# Patient Record
Sex: Female | Born: 1980 | Race: White | Hispanic: No | Marital: Single | Smoking: Current every day smoker
Health system: Southern US, Community
[De-identification: ages and names within clinical notes are randomized; demographics above are authoritative.]

## PROBLEM LIST (undated history)

## (undated) DIAGNOSIS — E7800 Pure hypercholesterolemia, unspecified: Secondary | ICD-10-CM

## (undated) HISTORY — PX: CHOLECYSTECTOMY: SHX55

## (undated) HISTORY — PX: KNEE ARTHROSCOPY: SUR90

## (undated) HISTORY — PX: TUBAL LIGATION: SHX77

---

## 2004-03-15 ENCOUNTER — Emergency Department (HOSPITAL_COMMUNITY): Admission: EM | Admit: 2004-03-15 | Discharge: 2004-03-15 | Payer: Self-pay | Admitting: Emergency Medicine

## 2014-12-27 ENCOUNTER — Emergency Department: Payer: Self-pay | Admitting: Emergency Medicine

## 2015-06-23 ENCOUNTER — Emergency Department
Admission: EM | Admit: 2015-06-23 | Discharge: 2015-06-23 | Disposition: A | Discharge status: Home / Self Care | Payer: Commercial Managed Care - HMO | Attending: Emergency Medicine | Admitting: Emergency Medicine

## 2015-06-23 ENCOUNTER — Encounter: Payer: Self-pay | Admitting: *Deleted

## 2015-06-23 ENCOUNTER — Emergency Department: Payer: Commercial Managed Care - HMO

## 2015-06-23 DIAGNOSIS — Z79899 Other long term (current) drug therapy: Secondary | ICD-10-CM | POA: Insufficient documentation

## 2015-06-23 DIAGNOSIS — Z7982 Long term (current) use of aspirin: Secondary | ICD-10-CM | POA: Diagnosis not present

## 2015-06-23 DIAGNOSIS — Z880 Allergy status to penicillin: Secondary | ICD-10-CM | POA: Insufficient documentation

## 2015-06-23 DIAGNOSIS — Z720 Tobacco use: Secondary | ICD-10-CM | POA: Insufficient documentation

## 2015-06-23 DIAGNOSIS — R079 Chest pain, unspecified: Secondary | ICD-10-CM | POA: Diagnosis present

## 2015-06-23 HISTORY — DX: Pure hypercholesterolemia, unspecified: E78.00

## 2015-06-23 LAB — CBC
HCT: 42.8 % (ref 35.0–47.0)
HEMOGLOBIN: 14.2 g/dL (ref 12.0–16.0)
MCH: 30.3 pg (ref 26.0–34.0)
MCHC: 33.1 g/dL (ref 32.0–36.0)
MCV: 91.3 fL (ref 80.0–100.0)
PLATELETS: 278 10*3/uL (ref 150–440)
RBC: 4.69 MIL/uL (ref 3.80–5.20)
RDW: 13.3 % (ref 11.5–14.5)
WBC: 18.6 10*3/uL — AB (ref 3.6–11.0)

## 2015-06-23 LAB — COMPREHENSIVE METABOLIC PANEL
ALK PHOS: 82 U/L (ref 38–126)
ALT: 21 U/L (ref 14–54)
AST: 23 U/L (ref 15–41)
Albumin: 3.7 g/dL (ref 3.5–5.0)
Anion gap: 8 (ref 5–15)
BUN: 7 mg/dL (ref 6–20)
CHLORIDE: 107 mmol/L (ref 101–111)
CO2: 24 mmol/L (ref 22–32)
Calcium: 8.8 mg/dL — ABNORMAL LOW (ref 8.9–10.3)
Creatinine, Ser: 0.58 mg/dL (ref 0.44–1.00)
Glucose, Bld: 96 mg/dL (ref 65–99)
POTASSIUM: 3.7 mmol/L (ref 3.5–5.1)
Sodium: 139 mmol/L (ref 135–145)
Total Bilirubin: 0.5 mg/dL (ref 0.3–1.2)
Total Protein: 7 g/dL (ref 6.5–8.1)

## 2015-06-23 LAB — TROPONIN I: Troponin I: <0.03 ng/mL (ref <0.031)

## 2015-06-23 LAB — FIBRIN DERIVATIVES D-DIMER (ARMC ONLY): Fibrin derivatives D-dimer (ARMC): 269.01 (ref 0–499)

## 2015-06-23 NOTE — Discharge Instructions (Signed)

## 2015-06-23 NOTE — ED Notes (Signed)
Pt here with c/o chest pain, sharp in nature left side of chest.  EMS placed IV and completed EKG.

## 2015-06-23 NOTE — ED Notes (Signed)
Pt given coke, crackers and peanut butter. Ok'ed by Dr. Cyril Loosen

## 2015-06-23 NOTE — ED Provider Notes (Signed)
[facility] Emergency Department Provider Note  ____________________________________________  Time seen: On arrival, via EMS  I have reviewed the triage vital signs and the nursing notes.   HISTORY  Chief Complaint Chest Pain    HPI Kimberly Lambert is a 34 y.o. female who presents with complaints of chest pain. She reports she initially had pain yesterday at 6 PM. She described as a pain in her side that traveled under her left breast into her sternum. It resolved after she took a baby aspirin and she felt well until 4 AM this morning when she developed pain when she got up to go the bathroom. She reports the pain was reproduced by EMS pressure on her sternum. She denies shortness of breath today. She reports she smokes has high cholesterol and her mother died at age 64 with a massive heart attack. Currently she reports her chest pain is achy and very mild. No recent travel, no calf pain.     Past Medical History  Diagnosis Date  . High cholesterol     There are no active problems to display for this patient.   Past Surgical History  Procedure Laterality Date  . Knee arthroscopy    . Tubal ligation    . Cholecystectomy      Current Outpatient Rx  Name  Route  Sig  Dispense  Refill  . aspirin EC 81 MG tablet   Oral   Take 81 mg by mouth daily.         . pravastatin (PRAVACHOL) 20 MG tablet   Oral   Take 20 mg by mouth daily.         . propranolol (INDERAL) 40 MG tablet   Oral   Take 40 mg by mouth 2 (two) times daily.           Allergies Claritin-d 12 hour; Naproxen; and Penicillins  No family history on file.  Social History History  Substance Use Topics  . Smoking status: Current Every Day Smoker -- 1.50 packs/day for 18 years    Types: Cigarettes  . Smokeless tobacco: Not on file  . Alcohol Use: No    Review of Systems  Constitutional: Negative for fever. Eyes: Negative for visual changes. ENT: Negative for sore  throat Cardiovascular: Positive for chest pain Respiratory: Negative for shortness of breath. Gastrointestinal: Negative for abdominal pain, vomiting and diarrhea. Genitourinary: Negative for dysuria. Musculoskeletal: Negative for back pain. Skin: Negative for rash. Neurological: Negative for headaches or focal weakness Psychiatric: No anxiety    ____________________________________________   PHYSICAL EXAM:  VITAL SIGNS: BP 107/74 mmHg  Pulse 69  Temp(Src) 97.8 F (36.6 C) (Oral)  Resp 18  Ht  (1.626 m)  Wt 180 lb (81.647 kg)  BMI 30.88 kg/m2  SpO2 94%  LMP 05/23/2015  ED Triage Vitals  Enc Vitals Group     BP --      Pulse --      Resp --      Temp --      Temp src --      SpO2 --      Weight --      Height --      Head Cir --      Peak Flow --      Pain Score --      Pain Loc --      Pain Edu? --      Excl. in GC? --      Constitutional:  Alert and oriented. Well appearing and in no distress. Eyes: Conjunctivae are normal.  ENT   Head: Normocephalic and atraumatic.   Mouth/Throat: Mucous membranes are moist. Cardiovascular: Normal rate, regular rhythm. Normal and symmetric distal pulses are present in all extremities. No murmurs, rubs, or gallops. Respiratory: Normal respiratory effort without tachypnea nor retractions. Breath sounds are clear and equal bilaterally.  Gastrointestinal: Soft and non-tender in all quadrants. No distention. There is no CVA tenderness. Genitourinary: deferred Musculoskeletal: Nontender with normal range of motion in all extremities. No lower extremity tenderness nor edema. Neurologic:  Normal speech and language. No gross focal neurologic deficits are appreciated. Skin:  Skin is warm, dry and intact. No rash noted. Psychiatric: Mood and affect are normal. Patient exhibits appropriate insight and judgment.  ____________________________________________    LABS (pertinent positives/negatives)  Labs Reviewed  CBC  - Abnormal; Notable for the following:    WBC 18.6 (*)    All other components within normal limits  COMPREHENSIVE METABOLIC PANEL - Abnormal; Notable for the following:    Calcium 8.8 (*)    All other components within normal limits  TROPONIN I  FIBRIN DERIVATIVES D-DIMER ([facility] ONLY)  TROPONIN I    ____________________________________________   EKG  ED ECG REPORT I, Jene Every, the attending physician, personally viewed and interpreted this ECG.   Date: 06/23/2015  EKG Time: 9:16 AM  Rate: 73  Rhythm: normal sinus rhythm  Axis: Normal  Intervals:none  ST&T Change: None   ____________________________________________    RADIOLOGY I have personally reviewed any xrays that were ordered on this patient: Chest x-ray unremarkable  ____________________________________________   PROCEDURES  Procedure(s) performed: none  Critical Care performed: none  ____________________________________________   INITIAL IMPRESSION / ASSESSMENT AND PLAN / ED COURSE  Pertinent labs & imaging results that were available during my care of the patient were reviewed by me and considered in my medical decision making (see chart for details).  Visual prominent and d-dimer are negative. We will send a second troponin. Unclear cause of elevated white blood cell count, no signs of infection or distress  Second troponin negative. Patient chest pain-free. She will follow-up with cardiology tomorrow for further evaluation. She knows to return to the emergency department if any return of her symptoms  ____________________________________________   FINAL CLINICAL IMPRESSION(S) / ED DIAGNOSES  Final diagnoses:  Chest pain, unspecified chest pain type     Jene Every, MD 06/23/15 [phone]

## 2016-02-08 DIAGNOSIS — L308 Other specified dermatitis: Secondary | ICD-10-CM | POA: Insufficient documentation

## 2016-10-14 ENCOUNTER — Ambulatory Visit
Admission: EM | Admit: 2016-10-14 | Discharge: 2016-10-14 | Disposition: A | Discharge status: Home / Self Care | Payer: Medicare Other | Attending: Emergency Medicine | Admitting: Emergency Medicine

## 2016-10-14 ENCOUNTER — Encounter: Payer: Self-pay | Admitting: Gynecology

## 2016-10-14 DIAGNOSIS — J069 Acute upper respiratory infection, unspecified: Secondary | ICD-10-CM

## 2016-10-14 MED ORDER — CEFUROXIME AXETIL 250 MG PO TABS: ORAL_TABLET | ORAL | 0 refills | Status: DC

## 2016-10-14 NOTE — ED Provider Notes (Signed)
CSN: [id number]     Arrival date & time 10/14/16  1330 History   First MD Initiated Contact with Patient 10/14/16 1636     Chief Complaint  Patient presents with  . Cough   (Consider location/radiation/quality/duration/timing/severity/associated sxs/prior Treatment) HPI  This a [age] year old female who presents with cough that she's had for over a week. Concerned that she may have a bronchitis. Is not had a fever or chills.      Past Medical History:  Diagnosis Date  . High cholesterol    Past Surgical History:  Procedure Laterality Date  . CHOLECYSTECTOMY    . KNEE ARTHROSCOPY    . TUBAL LIGATION     No family history on file. Social History  Substance Use Topics  . Smoking status: Current Every Day Smoker    Packs/day: 1.50    Years: 18.00    Types: Cigarettes  . Smokeless tobacco: Never Used  . Alcohol use No   OB History    No data available     Review of Systems  Constitutional: Positive for activity change. Negative for appetite change, chills, fatigue and fever.  HENT: Positive for congestion, sinus pain and sinus pressure.   Respiratory: Positive for cough.   All other systems reviewed and are negative.   Allergies  Claritin-d 12 hour [loratadine-pseudoephedrine er]; Naproxen; and Penicillins  Home Medications   Prior to Admission medications   Medication Sig Start Date End Date Taking? Authorizing Provider  aspirin EC 81 MG tablet Take 81 mg by mouth daily.   Yes Historical Provider, MD  pravastatin (PRAVACHOL) 20 MG tablet Take 20 mg by mouth daily.   Yes Historical Provider, MD  propranolol (INDERAL) 40 MG tablet Take 40 mg by mouth 2 (two) times daily.   Yes Historical Provider, MD  cefUROXime (CEFTIN) 250 MG tablet Take 1 tablet twice daily until all gone; take with food 10/14/16   Lutricia FeilWilliam P Roemer, PA-C   Meds Ordered and Administered this Visit  Medications - No data to display  BP (!) 135/99 (BP Location: Right Arm)   Pulse 81   Temp 98.1  F (36.7 C)   Resp 16   Ht 5\' 4"  (1.626 m)   Wt 190 lb (86.2 kg)   LMP 10/07/2016   SpO2 99%   BMI 32.61 kg/m  No data found.   Physical Exam  Constitutional: She is oriented to person, place, and time. She appears well-developed and well-nourished. No distress.  HENT:  Head: Normocephalic and atraumatic.  Right Ear: External ear normal.  Left Ear: External ear normal.  Nose: Nose normal.  Mouth/Throat: Oropharynx is clear and moist.  Eyes: EOM are normal. Pupils are equal, round, and reactive to light. Right eye exhibits no discharge. Left eye exhibits no discharge.  Neck: Normal range of motion. Neck supple.  Pulmonary/Chest: Effort normal and breath sounds normal. No respiratory distress. She has no wheezes. She has no rales.  Musculoskeletal: Normal range of motion.  Lymphadenopathy:    She has no cervical adenopathy.  Neurological: She is alert and oriented to person, place, and time.  Skin: Skin is warm and dry. She is not diaphoretic.  Psychiatric: She has a normal mood and affect. Her behavior is normal. Judgment and thought content normal.  Nursing note and vitals reviewed.   Urgent Care Course   Clinical Course     Procedures (including critical care time)  Labs Review Labs Reviewed - No data to display  Imaging Review No results found.  Visual Acuity Review  Right Eye Distance:   Left Eye Distance:   Bilateral Distance:    Right Eye Near:   Left Eye Near:    Bilateral Near:         MDM   1. Acute upper respiratory infection    Discharge Medication List as of 10/14/2016  4:39 PM    START taking these medications   Details  cefUROXime (CEFTIN) 250 MG tablet Take 1 tablet twice daily until all gone; take with food, Normal      Plan: 1. Test/x-ray results and diagnosis reviewed with patient 2. rx as per orders; risks, benefits, potential side effects reviewed with patient 3. Recommend supportive treatment with Rest and hydration. Is  not improving she should follow-up with her primary care physician. I requested Her to use Flonase which she states she has at home. 4. F/u prn if symptoms worsen or don't improve     Lutricia FeilWilliam P Roemer, PA-C 10/14/16 1818

## 2016-10-14 NOTE — ED Triage Notes (Signed)
Per patient cough over 1 week . Patietn question bronchitis.

## 2016-10-31 ENCOUNTER — Other Ambulatory Visit: Payer: Self-pay | Admitting: Podiatry

## 2016-10-31 DIAGNOSIS — G5762 Lesion of plantar nerve, left lower limb: Secondary | ICD-10-CM

## 2016-11-14 ENCOUNTER — Ambulatory Visit: Payer: Medicare Other

## 2016-11-16 ENCOUNTER — Encounter: Payer: Self-pay | Admitting: Dietician

## 2016-11-24 ENCOUNTER — Ambulatory Visit
Admission: RE | Admit: 2016-11-24 | Discharge: 2016-11-24 | Disposition: A | Discharge status: Home / Self Care | Payer: Medicare Other | Source: Ambulatory Visit | Attending: Podiatry | Admitting: Podiatry

## 2016-11-24 DIAGNOSIS — G5762 Lesion of plantar nerve, left lower limb: Secondary | ICD-10-CM | POA: Diagnosis present

## 2016-11-24 DIAGNOSIS — M85672 Other cyst of bone, left ankle and foot: Secondary | ICD-10-CM | POA: Diagnosis not present

## 2016-11-24 MED ORDER — GADOBENATE DIMEGLUMINE 529 MG/ML IV SOLN
20.0000 mL | Freq: Once | INTRAVENOUS | Status: AC | PRN
  Administered 2016-11-24: 20 mL via INTRAVENOUS

## 2016-11-28 ENCOUNTER — Encounter: Payer: Self-pay | Admitting: Dietician

## 2016-11-28 NOTE — Progress Notes (Signed)
Patient did not keep her appointment on 11/16/16. Sent letter to MD.

## 2017-01-14 ENCOUNTER — Inpatient Hospital Stay: Payer: Medicare Other

## 2017-01-14 ENCOUNTER — Encounter: Payer: Self-pay | Admitting: Oncology

## 2017-01-14 ENCOUNTER — Inpatient Hospital Stay: Payer: Medicare Other | Attending: Oncology | Admitting: Oncology

## 2017-01-14 VITALS — BP 123/86 | HR 94 | Temp 97.8°F | Resp 20 | Ht 64.37 in | Wt 251.5 lb

## 2017-01-14 DIAGNOSIS — Z79899 Other long term (current) drug therapy: Secondary | ICD-10-CM | POA: Insufficient documentation

## 2017-01-14 DIAGNOSIS — D72829 Elevated white blood cell count, unspecified: Secondary | ICD-10-CM | POA: Insufficient documentation

## 2017-01-14 DIAGNOSIS — Z7982 Long term (current) use of aspirin: Secondary | ICD-10-CM | POA: Diagnosis not present

## 2017-01-14 DIAGNOSIS — E7800 Pure hypercholesterolemia, unspecified: Secondary | ICD-10-CM | POA: Diagnosis not present

## 2017-01-14 DIAGNOSIS — F17210 Nicotine dependence, cigarettes, uncomplicated: Secondary | ICD-10-CM | POA: Diagnosis not present

## 2017-01-14 DIAGNOSIS — E785 Hyperlipidemia, unspecified: Secondary | ICD-10-CM | POA: Insufficient documentation

## 2017-01-14 DIAGNOSIS — I499 Cardiac arrhythmia, unspecified: Secondary | ICD-10-CM | POA: Insufficient documentation

## 2017-01-14 LAB — DIFFERENTIAL
BASOS ABS: 0.2 10*3/uL — AB (ref 0–0.1)
Basophils Relative: 1 %
Eosinophils Absolute: 0.6 10*3/uL (ref 0–0.7)
Eosinophils Relative: 3 %
LYMPHS ABS: 5.4 10*3/uL — AB (ref 1.0–3.6)
LYMPHS PCT: 29 %
Monocytes Absolute: 1 10*3/uL — ABNORMAL HIGH (ref 0.2–0.9)
Monocytes Relative: 5 %
NEUTROS PCT: 62 %
Neutro Abs: 11.4 10*3/uL — ABNORMAL HIGH (ref 1.4–6.5)

## 2017-01-14 LAB — CBC
HCT: 43.7 % (ref 35.0–47.0)
HEMOGLOBIN: 14.8 g/dL (ref 12.0–16.0)
MCH: 30.7 pg (ref 26.0–34.0)
MCHC: 33.8 g/dL (ref 32.0–36.0)
MCV: 90.8 fL (ref 80.0–100.0)
PLATELETS: 318 10*3/uL (ref 150–440)
RBC: 4.81 MIL/uL (ref 3.80–5.20)
RDW: 13.5 % (ref 11.5–14.5)
WBC: 18.6 10*3/uL — AB (ref 3.6–11.0)

## 2017-01-14 LAB — FOLATE: FOLATE: 4.6 ng/mL — AB (ref >5.9)

## 2017-01-14 LAB — SEDIMENTATION RATE: SED RATE: 23 mm/h — AB (ref 0–20)

## 2017-01-14 NOTE — Progress Notes (Signed)
New referral for leucocytosis. Denies night sweats or fevers. Denies any recent infections. No noticeable swollen glands. Patient says she stays tired all the time. 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

## 2017-01-14 NOTE — Progress Notes (Signed)
Hematology/Oncology Consult note [facility] Telephone:([phone] [phone]  Patient Care Team: Sharyne Peach, MD as PCP - General (Family Medicine)   Name of the patient: Kimberly Lambert  [id number]  [birth year]    Reason for referral- leucocytosis   Referring physician- Dr. Salome Holmes  Date of visit: 01/14/17   History of presenting illness- patient is a 36 year old female who was been referred to [place] for evaluation and management of leukocytosis. CBC from 01/09/2017 revealed white count of 18.8, H&H of 14.6/43.4 and a platelet count of 354. Differential on the CBC CEA showed neutrophilia and lymphocytosis and mild monocytosis. Prior CBC from 01/03/2017 showed a white count of 20.1. CMP was within normal limits. Prior CBC from August 2016 showed a white count of 18.6. Patient reports that she is always had a white count that was high even when she was in [place] which was more than 3 years ago. She has been smoking daily about 2-3 cigarettes per day over the last 20 years. Denies any appetite loss or unintentional weight loss or recurrent infections. She does report burning sensation in her feet  ECOG PS- 1  Pain scale- 3   Review of systems- Review of Systems  Constitutional: Negative for chills, fever, malaise/fatigue and weight loss.  HENT: Negative for congestion, ear discharge and nosebleeds.   Eyes: Negative for blurred vision.  Respiratory: Negative for cough, hemoptysis, sputum production, shortness of breath and wheezing.   Cardiovascular: Negative for chest pain, palpitations, orthopnea and claudication.  Gastrointestinal: Negative for abdominal pain, blood in stool, constipation, diarrhea, heartburn, melena, nausea and vomiting.  Genitourinary: Negative for dysuria, flank pain, frequency, hematuria and urgency.  Musculoskeletal: Negative for back pain, joint pain and myalgias.  Skin: Negative for rash.  Neurological: Negative for  dizziness, tingling, focal weakness, seizures, weakness and headaches.  Endo/Heme/Allergies: Does not bruise/bleed easily.  Psychiatric/Behavioral: Negative for depression and suicidal ideas. The patient does not have insomnia.     Allergies  Allergen Reactions  . Claritin-D 12 Hour [Loratadine-Pseudoephedrine Er] Rash  . Naproxen Rash  . Penicillins Rash    Patient Active Problem List   Diagnosis Date Noted  . Dysrhythmia 01/14/2017  . Hyperlipidemia, unspecified 01/14/2017  . 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 High cholesterol      Past Surgical History:  Procedure Laterality Date  . CHOLECYSTECTOMY    . KNEE ARTHROSCOPY    . TUBAL LIGATION      Social History   Social History  . Marital status: Single    Spouse name: N/A  . Number of children: N/A  . Years of education: N/A   Occupational History  . Not on file.   Social History Main Topics  . Smoking status: Current Every Day Smoker    Packs/day: 1.50    Years: 18.00    Types: Cigarettes  . Smokeless tobacco: Never Used  . Alcohol use No  . Drug use: No  . Sexual activity: Yes   Other Topics Concern  . Not on file   Social History Narrative  . No narrative on file     No family history on file.   Current Outpatient Prescriptions:  .  aspirin EC 81 MG tablet, Take 81 mg by mouth daily., Disp: , Rfl:  .  pravastatin (PRAVACHOL) 20 MG tablet, Take 20 mg by mouth daily., Disp: , Rfl:  .  propranolol (INDERAL) 40 MG tablet, Take 40 mg  by mouth 2 (two) times daily., Disp: , Rfl:    Physical exam:  Vitals:   01/14/17 1457  BP: 123/86  Pulse: 94  Resp: 20  Temp: 97.8 F (36.6 C)  TempSrc: Tympanic  Weight: 251 lb 8.7 oz (114.1 kg)  Height: 5' 4.37" (1.635 m)   Physical Exam  Constitutional: She is oriented to person, place, and time.  Patient is obese and appears in no acute distress.  HENT:  Head: Normocephalic and atraumatic.  endentulous    Eyes: EOM are normal. Pupils are equal, round, and reactive to light.  Neck: Normal range of motion.  Cardiovascular: Normal rate, regular rhythm and normal heart sounds.   Pulmonary/Chest: Effort normal and breath sounds normal.  Abdominal: Soft. Bowel sounds are normal.  Neurological: She is alert and oriented to person, place, and time.  Skin: Skin is warm and dry.       CMP Latest Ref Rng & Units 06/23/2015  Glucose 65 - 99 mg/dL 96  BUN 6 - 20 mg/dL 7  Creatinine 0.44 - 1.00 mg/dL 0.58  Sodium 135 - 145 mmol/L 139  Potassium 3.5 - 5.1 mmol/L 3.7  Chloride 101 - 111 mmol/L 107  CO2 22 - 32 mmol/L 24  Calcium 8.9 - 10.3 mg/dL 8.8(L)  Total Protein 6.5 - 8.1 g/dL 7.0  Total Bilirubin 0.3 - 1.2 mg/dL 0.5  Alkaline Phos 38 - 126 U/L 82  AST 15 - 41 U/L 23  ALT 14 - 54 U/L 21   CBC Latest Ref Rng & Units 06/23/2015  WBC 3.6 - 11.0 K/uL 18.6(H)  Hemoglobin 12.0 - 16.0 g/dL 14.2  Hematocrit 35.0 - 47.0 % 42.8  Platelets 150 - 440 K/uL 278     Assessment and plan- Patient is a 36 y.o. 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  On reviewing patient's prior CBCs she is always had an elevated white count of around 18. Most recent differential on the CBC reveals neutrophilia and lymphocytosis as well as monocytosis. Today I will repeat his CBC with manual differential and obtained a pathologic review of her smear. I will also check ESR, CRP, peripheral flow cytometry and BCR abl testing. I will see the patient back in 2 weeks' time to discuss the results of her blood work and further intervention is need be.   Thank you for this kind referral and the opportunity to participate in the care of this patient   Visit Diagnosis 1. Leukocytosis, unspecified type     Dr. Randa Evens, MD, MPH [facility] at [facility] Pager- [phone] 01/14/2017  2:44 PM

## 2017-01-15 ENCOUNTER — Other Ambulatory Visit: Payer: Self-pay | Admitting: Oncology

## 2017-01-15 DIAGNOSIS — E538 Deficiency of other specified B group vitamins: Secondary | ICD-10-CM | POA: Insufficient documentation

## 2017-01-15 LAB — PATHOLOGIST SMEAR REVIEW

## 2017-01-15 LAB — VITAMIN B12: Vitamin B-12: 133 pg/mL — ABNORMAL LOW (ref 180–914)

## 2017-01-15 LAB — C-REACTIVE PROTEIN: CRP: 3.5 mg/dL — AB (ref <1.0)

## 2017-01-15 NOTE — Progress Notes (Signed)
Patient found to have severe B 12 deficiency. She also complains of bilateral LE burning which could be from b12 deficiency. Will give 1000 mcg weekly X 4 doses followed by monthly doses for 6 months. Check anti intrinsic factor ab with next visit

## 2017-01-18 ENCOUNTER — Encounter: Payer: Self-pay | Admitting: Family Medicine

## 2017-01-19 LAB — BCR-ABL1, CML/ALL, PCR, QUANT

## 2017-01-19 LAB — COMP PANEL: LEUKEMIA/LYMPHOMA

## 2017-01-21 ENCOUNTER — Telehealth: Payer: Self-pay | Admitting: *Deleted

## 2017-01-21 NOTE — Telephone Encounter (Signed)
I called patient to let her know Dr Smith Robertao would like her to start taking B12 injections as her blood level was low. Patient would be taking 1 every week for 4 weeks then  1 injection monthly therafter. Patient is in agreement and would like to start taking injections this Wednesday morning in [place] [facility] at 10am.. Scheduling order was put in system.

## 2017-01-23 ENCOUNTER — Inpatient Hospital Stay: Payer: Medicare Other | Attending: Oncology

## 2017-01-23 DIAGNOSIS — E7800 Pure hypercholesterolemia, unspecified: Secondary | ICD-10-CM | POA: Diagnosis not present

## 2017-01-23 DIAGNOSIS — Z79899 Other long term (current) drug therapy: Secondary | ICD-10-CM | POA: Insufficient documentation

## 2017-01-23 DIAGNOSIS — E538 Deficiency of other specified B group vitamins: Secondary | ICD-10-CM | POA: Insufficient documentation

## 2017-01-23 DIAGNOSIS — D72829 Elevated white blood cell count, unspecified: Secondary | ICD-10-CM | POA: Diagnosis present

## 2017-01-23 DIAGNOSIS — F17210 Nicotine dependence, cigarettes, uncomplicated: Secondary | ICD-10-CM | POA: Insufficient documentation

## 2017-01-23 DIAGNOSIS — Z7982 Long term (current) use of aspirin: Secondary | ICD-10-CM | POA: Insufficient documentation

## 2017-01-23 MED ORDER — CYANOCOBALAMIN 1000 MCG/ML IJ SOLN
1000.0000 ug | Freq: Once | INTRAMUSCULAR | Status: AC
  Administered 2017-01-23: 1000 ug via INTRAMUSCULAR
  Filled 2017-01-23: qty 1

## 2017-01-28 ENCOUNTER — Inpatient Hospital Stay: Payer: Medicare Other | Admitting: Oncology

## 2017-01-28 NOTE — Progress Notes (Unsigned)
Hematology/Oncology Consult note [facility]  Telephone:([phone] [phone]  Patient Care Team: Sharyne Peach, MD as PCP - General (Family Medicine)   Name of the patient: Kimberly Lambert  [id number]  06-Jul-1981   Date of visit: 01/28/17  Diagnosis- leucocytosis  Chief complaint/ Reason for visit- discuss results of bloodwork  Heme/Onc history: patient is a 36 year old female who was been referred to [place] for evaluation and management of leukocytosis. CBC from 01/09/2017 revealed white count of 18.8, H&H of 14.6/43.4 and a platelet count of 354. Differential on the CBC CEA showed neutrophilia and lymphocytosis and mild monocytosis. Prior CBC from 01/03/2017 showed a white count of 20.1. CMP was within normal limits. Prior CBC from August 2016 showed a white count of 18.6. Patient reports that she is always had a white count that was high even when she was in [place] which was more than 3 years ago. She has been smoking daily about 2-3 cigarettes per day over the last 20 years. Denies any appetite loss or unintentional weight loss or recurrent infections. She does report burning sensation in her feet  Results of blood work from 01/14/2017 were as follows: CBC showed white count of 18.6, H&H of 14.8/43.7 and a platelet count of 318. Differential showing absolute neutrophilia along with lymphocytosis and monocytosis as well as mild basophilia. Review of peripheral blood smear showed absolute neutrophilia with lymphocytosis and monocytosis. RBC WBC and platelet morphology is unremarkable. ESR was elevated at 23 and CRP was elevated at 3.5. Peripheral flow cytometry showed no significant diagnostic immunophenotyping abnormality. Lymphocytosis is due to increase in Total T cells- T helper cells T suppressor cells and B cells. Increase in multiple lymphocyte subsets may suggest a reactive process. BCR abl testing was negative for CML. Folate level was low at 4.6.  B12 was low at 133.   Interval history- ***  ECOG PS- *** Pain scale- *** Opioid associated constipation- ***  Review of systems- Review of Systems  Constitutional: Negative for chills, fever, malaise/fatigue and weight loss.  HENT: Negative for congestion, ear discharge and nosebleeds.   Eyes: Negative for blurred vision.  Respiratory: Negative for cough, hemoptysis, sputum production, shortness of breath and wheezing.   Cardiovascular: Negative for chest pain, palpitations, orthopnea and claudication.  Gastrointestinal: Negative for abdominal pain, blood in stool, constipation, diarrhea, heartburn, melena, nausea and vomiting.  Genitourinary: Negative for dysuria, flank pain, frequency, hematuria and urgency.  Musculoskeletal: Negative for back pain, joint pain and myalgias.  Skin: Negative for rash.  Neurological: Negative for dizziness, tingling, focal weakness, seizures, weakness and headaches.  Endo/Heme/Allergies: Does not bruise/bleed easily.  Psychiatric/Behavioral: Negative for depression and suicidal ideas. The patient does not have insomnia.      Current treatment- ***  Allergies  Allergen Reactions  . Claritin-D 12 Hour [Loratadine-Pseudoephedrine Er] Rash  . Naproxen Rash  . Penicillins Rash     Past Medical History:  Diagnosis Date  . High cholesterol      Past Surgical History:  Procedure Laterality Date  . CHOLECYSTECTOMY    . CHOLECYSTECTOMY     17 years ago  . KNEE ARTHROSCOPY    . TUBAL LIGATION      Social History   Social History  . Marital status: Single    Spouse name: N/A  . Number of children: N/A  . Years of education: N/A   Occupational History  . Not on file.   Social History Main Topics  . Smoking status:  Current Every Day Smoker    Packs/day: 1.50    Years: 18.00    Types: Cigarettes  . Smokeless tobacco: Never Used  . Alcohol use No  . Drug use: No  . Sexual activity: Yes   Other Topics Concern  . Not on file    Social History Narrative  . No narrative on file    Family History  Problem Relation Age of Onset  . Cancer Paternal Aunt      Current Outpatient Prescriptions:  .  aspirin EC 81 MG tablet, Take 81 mg by mouth daily., Disp: , Rfl:  .  pravastatin (PRAVACHOL) 20 MG tablet, Take 20 mg by mouth daily., Disp: , Rfl:  .  propranolol (INDERAL) 40 MG tablet, Take 40 mg by mouth 2 (two) times daily., Disp: , Rfl:   Physical exam: There were no vitals filed for this visit. Physical Exam  Constitutional: She is oriented to person, place, and time and well-developed, well-nourished, and in no distress.  HENT:  Head: Normocephalic and atraumatic.  Eyes: EOM are normal. Pupils are equal, round, and reactive to light.  Neck: Normal range of motion.  Cardiovascular: Normal rate, regular rhythm and normal heart sounds.   Pulmonary/Chest: Effort normal and breath sounds normal.  Abdominal: Soft. Bowel sounds are normal.  Neurological: She is alert and oriented to person, place, and time.  Skin: Skin is warm and dry.     CMP Latest Ref Rng & Units 06/23/2015  Glucose 65 - 99 mg/dL 96  BUN 6 - 20 mg/dL 7  Creatinine 0.44 - 1.00 mg/dL 0.58  Sodium 135 - 145 mmol/L 139  Potassium 3.5 - 5.1 mmol/L 3.7  Chloride 101 - 111 mmol/L 107  CO2 22 - 32 mmol/L 24  Calcium 8.9 - 10.3 mg/dL 8.8(L)  Total Protein 6.5 - 8.1 g/dL 7.0  Total Bilirubin 0.3 - 1.2 mg/dL 0.5  Alkaline Phos 38 - 126 U/L 82  AST 15 - 41 U/L 23  ALT 14 - 54 U/L 21   CBC Latest Ref Rng & Units 01/14/2017  WBC 3.6 - 11.0 K/uL 18.6(H)  Hemoglobin 12.0 - 16.0 g/dL 14.8  Hematocrit 35.0 - 47.0 % 43.7  Platelets 150 - 440 K/uL 318    No images are attached to the encounter.  No results found.   Assessment and plan- Patient is a 36 y.o. female ***   Visit Diagnosis No diagnosis found.   Dr. Randa Evens, MD, MPH [place] at [facility] Pager- [phone] 01/28/2017 8:31 AM

## 2017-01-30 ENCOUNTER — Inpatient Hospital Stay: Payer: Medicare Other

## 2017-01-30 VITALS — BP 129/86 | HR 80 | Temp 97.3°F | Resp 20

## 2017-01-30 DIAGNOSIS — E538 Deficiency of other specified B group vitamins: Secondary | ICD-10-CM

## 2017-01-30 DIAGNOSIS — D72829 Elevated white blood cell count, unspecified: Secondary | ICD-10-CM | POA: Diagnosis not present

## 2017-01-30 MED ORDER — CYANOCOBALAMIN 1000 MCG/ML IJ SOLN
1000.0000 ug | Freq: Once | INTRAMUSCULAR | Status: AC
  Administered 2017-01-30: 1000 ug via INTRAMUSCULAR
  Filled 2017-01-30: qty 1

## 2017-01-30 NOTE — Patient Instructions (Signed)
Cyanocobalamin, Vitamin B12 injection What is this medicine? CYANOCOBALAMIN (sye an oh koe BAL a min) is a man made form of vitamin B12. Vitamin B12 is used in the growth of healthy blood cells, nerve cells, and proteins in the body. It also helps with the metabolism of fats and carbohydrates. This medicine is used to treat people who can not absorb vitamin B12. This medicine may be used for other purposes; ask your health care provider or pharmacist if you have questions. COMMON BRAND NAME(S): B-12 Compliance Kit, B-12 Injection Kit, Cyomin, LA-12, Nutri-Twelve, Physicians EZ Use B-12, Primabalt What should I tell my health care provider before I take this medicine? They need to know if you have any of these conditions: -kidney disease -Leber's disease -megaloblastic anemia -an unusual or allergic reaction to cyanocobalamin, cobalt, other medicines, foods, dyes, or preservatives -pregnant or trying to get pregnant -breast-feeding How should I use this medicine? This medicine is injected into a muscle or deeply under the skin. It is usually given by a health care professional in a clinic or doctor's office. However, your doctor may teach you how to inject yourself. Follow all instructions. Talk to your pediatrician regarding the use of this medicine in children. Special care may be needed. Overdosage: If you think you have taken too much of this medicine contact a poison control center or emergency room at once. NOTE: This medicine is only for you. Do not share this medicine with others. What if I miss a dose? If you are given your dose at a clinic or doctor's office, call to reschedule your appointment. If you give your own injections and you miss a dose, take it as soon as you can. If it is almost time for your next dose, take only that dose. Do not take double or extra doses. What may interact with this medicine? -colchicine -heavy alcohol intake This list may not describe all possible  interactions. Give your health care provider a list of all the medicines, herbs, non-prescription drugs, or dietary supplements you use. Also tell them if you smoke, drink alcohol, or use illegal drugs. Some items may interact with your medicine. What should I watch for while using this medicine? Visit your doctor or health care professional regularly. You may need blood work done while you are taking this medicine. You may need to follow a special diet. Talk to your doctor. Limit your alcohol intake and avoid smoking to get the best benefit. What side effects may I notice from receiving this medicine? Side effects that you should report to your doctor or health care professional as soon as possible: -allergic reactions like skin rash, itching or hives, swelling of the face, lips, or tongue -blue tint to skin -chest tightness, pain -difficulty breathing, wheezing -dizziness -red, swollen painful area on the leg Side effects that usually do not require medical attention (report to your doctor or health care professional if they continue or are bothersome): -diarrhea -headache This list may not describe all possible side effects. Call your doctor for medical advice about side effects. You may report side effects to FDA at 1-800-FDA-1088. Where should I keep my medicine? Keep out of the reach of children. Store at room temperature between 15 and 30 degrees C (59 and 85 degrees F). Protect from light. Throw away any unused medicine after the expiration date. NOTE: This sheet is a summary. It may not cover all possible information. If you have questions about this medicine, talk to your doctor, pharmacist, or   health care provider.  2018 Elsevier/Gold Standard (2008-02-16 22:10:20)  

## 2017-02-06 ENCOUNTER — Inpatient Hospital Stay: Payer: Medicare Other

## 2017-02-06 VITALS — BP 120/86 | HR 80 | Resp 20

## 2017-02-06 DIAGNOSIS — E538 Deficiency of other specified B group vitamins: Secondary | ICD-10-CM

## 2017-02-06 DIAGNOSIS — D72829 Elevated white blood cell count, unspecified: Secondary | ICD-10-CM | POA: Diagnosis not present

## 2017-02-06 MED ORDER — CYANOCOBALAMIN 1000 MCG/ML IJ SOLN
1000.0000 ug | Freq: Once | INTRAMUSCULAR | Status: AC
  Administered 2017-02-06: 1000 ug via INTRAMUSCULAR
  Filled 2017-02-06: qty 1

## 2017-02-11 ENCOUNTER — Ambulatory Visit: Payer: Medicare Other | Admitting: Oncology

## 2017-02-13 ENCOUNTER — Inpatient Hospital Stay: Payer: Medicare Other

## 2017-02-14 ENCOUNTER — Inpatient Hospital Stay: Payer: Medicare Other

## 2017-02-14 VITALS — BP 135/88 | HR 85 | Temp 98.2°F | Resp 20

## 2017-02-14 DIAGNOSIS — D72829 Elevated white blood cell count, unspecified: Secondary | ICD-10-CM | POA: Diagnosis not present

## 2017-02-14 DIAGNOSIS — E538 Deficiency of other specified B group vitamins: Secondary | ICD-10-CM

## 2017-02-14 MED ORDER — CYANOCOBALAMIN 1000 MCG/ML IJ SOLN
1000.0000 ug | Freq: Once | INTRAMUSCULAR | Status: AC
  Administered 2017-02-14: 1000 ug via INTRAMUSCULAR

## 2017-02-18 ENCOUNTER — Inpatient Hospital Stay: Payer: Medicare Other | Attending: Oncology | Admitting: Oncology

## 2017-02-18 ENCOUNTER — Encounter: Payer: Self-pay | Admitting: Oncology

## 2017-02-18 VITALS — BP 113/80 | HR 91 | Temp 97.8°F | Resp 8 | Wt 255.5 lb

## 2017-02-18 DIAGNOSIS — F17210 Nicotine dependence, cigarettes, uncomplicated: Secondary | ICD-10-CM | POA: Insufficient documentation

## 2017-02-18 DIAGNOSIS — E7800 Pure hypercholesterolemia, unspecified: Secondary | ICD-10-CM | POA: Insufficient documentation

## 2017-02-18 DIAGNOSIS — D72821 Monocytosis (symptomatic): Secondary | ICD-10-CM | POA: Diagnosis not present

## 2017-02-18 DIAGNOSIS — D72820 Lymphocytosis (symptomatic): Secondary | ICD-10-CM | POA: Diagnosis present

## 2017-02-18 DIAGNOSIS — Z79899 Other long term (current) drug therapy: Secondary | ICD-10-CM | POA: Diagnosis not present

## 2017-02-18 DIAGNOSIS — Z7982 Long term (current) use of aspirin: Secondary | ICD-10-CM | POA: Insufficient documentation

## 2017-02-18 DIAGNOSIS — E538 Deficiency of other specified B group vitamins: Secondary | ICD-10-CM

## 2017-02-18 DIAGNOSIS — D72825 Bandemia: Secondary | ICD-10-CM

## 2017-02-18 MED ORDER — B-12 1000 MCG PO CAPS: 1.0000 | ORAL_CAPSULE | Freq: Every day | ORAL | 2 refills | Status: DC

## 2017-02-18 NOTE — Progress Notes (Signed)
Patient wants to discuss B-12.  She does not feel like it is working for her.  States she still feels tired.

## 2017-02-18 NOTE — Progress Notes (Signed)
Hematology/Oncology Consult note [facility]  [phone] [phone]  Patient Care Team: Sharyne Peach, MD as PCP - General (Family Medicine)   Name of the patient: Kimberly Lambert  [id number]  12-23-1980   Date of visit: 02/18/17  Diagnosis- 1.  leucocytosis likely reactive 2. B12 deficiency  Chief complaint/ Reason for visit- discuss results of bloodwork  Heme/Onc history: patient is a 36 year old female who was been referred to [place] for evaluation and management of leukocytosis. CBC from 01/09/2017 revealed white count of 18.8, H&H of 14.6/43.4 and a platelet count of 354. Differential on the CBC CEA showed neutrophilia and lymphocytosis and mild monocytosis. Prior CBC from 01/03/2017 showed a white count of 20.1. CMP was within normal limits. Prior CBC from August 2016 showed a white count of 18.6. Patient reports that she is always had a white count that was high even when she was in [place] which was more than 3 years ago. She has been smoking daily about 2-3 cigarettes per day over the last 20 years. Denies any appetite loss or unintentional weight loss or recurrent infections. She does report burning sensation in her feet  Results of blood work from 01/14/2017 were as follows: CBC showed white count of 18.6, H&H of 14.8/43.7 and a platelet count of 318. Differential showing absolute neutrophilia along with lymphocytosis and monocytosis as well as mild basophilia. Review of peripheral blood smear showed absolute neutrophilia with lymphocytosis and monocytosis. RBC WBC and platelet morphology is unremarkable. ESR was elevated at 23 and CRP was elevated at 3.5. Peripheral flow cytometry showed no significant diagnostic immunophenotyping abnormality. Lymphocytosis is due to increase in Total T cells- T helper cells T suppressor cells and B cells. Increase in multiple lymphocyte subsets may suggest a reactive process. BCR abl testing was negative  for CML. Folate level was low at 4.6. B12 was low at 133.   Interval history- doing well. Reports pain in her back. Denies other complaints  ECOG PS- 1 Pain scale- 9 Opioid associated constipation- no  Review of systems- Review of Systems  Constitutional: Negative for chills, fever, malaise/fatigue and weight loss.  HENT: Negative for congestion, ear discharge and nosebleeds.   Eyes: Negative for blurred vision.  Respiratory: Negative for cough, hemoptysis, sputum production, shortness of breath and wheezing.   Cardiovascular: Negative for chest pain, palpitations, orthopnea and claudication.  Gastrointestinal: Negative for abdominal pain, blood in stool, constipation, diarrhea, heartburn, melena, nausea and vomiting.  Genitourinary: Negative for dysuria, flank pain, frequency, hematuria and urgency.  Musculoskeletal: Positive for back pain. Negative for joint pain and myalgias.  Skin: Negative for rash.  Neurological: Negative for dizziness, tingling, focal weakness, seizures, weakness and headaches.  Endo/Heme/Allergies: Does not bruise/bleed easily.  Psychiatric/Behavioral: Negative for depression and suicidal ideas. The patient does not have insomnia.      Current treatment- observation  Allergies  Allergen Reactions  . Claritin-D 12 Hour [Loratadine-Pseudoephedrine Er] Rash  . Naproxen Rash  . Penicillins Rash     Past Medical History:  Diagnosis Date  . High cholesterol      Past Surgical History:  Procedure Laterality Date  . CHOLECYSTECTOMY    . CHOLECYSTECTOMY     17 years ago  . KNEE ARTHROSCOPY    . TUBAL LIGATION      Social History   Social History  . Marital status: Single    Spouse name: N/A  . Number of children: N/A  . Years of education: N/A  Occupational History  . Not on file.   Social History Main Topics  . Smoking status: Current Every Day Smoker    Packs/day: 1.50    Years: 18.00    Types: Cigarettes  . Smokeless tobacco: Never  Used  . Alcohol use No  . Drug use: No  . Sexual activity: Yes   Other Topics Concern  . Not on file   Social History Narrative  . No narrative on file    Family History  Problem Relation Age of Onset  . Cancer Paternal Aunt      Current Outpatient Prescriptions:  .  aspirin EC 81 MG tablet, Take 81 mg by mouth daily., Disp: , Rfl:  .  pravastatin (PRAVACHOL) 20 MG tablet, Take 20 mg by mouth daily., Disp: , Rfl:  .  propranolol (INDERAL) 40 MG tablet, Take 40 mg by mouth 2 (two) times daily., Disp: , Rfl:  .  cyanocobalamin (,VITAMIN B-12,) 1000 MCG/ML injection, Inject 1,000 mcg into the muscle every 30 (thirty) days., Disp: , Rfl:  .  Cyanocobalamin (B-12) 1000 MCG CAPS, Take 1 tablet by mouth daily., Disp: 30 capsule, Rfl: 2  Physical exam:  Vitals:   02/18/17 1045  BP: 113/80  Pulse: 91  Resp: (!) 8  Temp: 97.8 F (36.6 C)  TempSrc: Tympanic  Weight: 255 lb 8.2 oz (115.9 kg)   Physical Exam  Constitutional: She is oriented to person, place, and time and well-developed, well-nourished, and in no distress.  HENT:  Head: Normocephalic and atraumatic.  edentulous  Eyes: EOM are normal. Pupils are equal, round, and reactive to light.  Neck: Normal range of motion.  Cardiovascular: Normal rate, regular rhythm and normal heart sounds.   Pulmonary/Chest: Effort normal and breath sounds normal.  Abdominal: Soft. Bowel sounds are normal.  Neurological: She is alert and oriented to person, place, and time.  Skin: Skin is warm and dry.     CMP Latest Ref Rng & Units 06/23/2015  Glucose 65 - 99 mg/dL 96  BUN 6 - 20 mg/dL 7  Creatinine 0.44 - 1.00 mg/dL 0.58  Sodium 135 - 145 mmol/L 139  Potassium 3.5 - 5.1 mmol/L 3.7  Chloride 101 - 111 mmol/L 107  CO2 22 - 32 mmol/L 24  Calcium 8.9 - 10.3 mg/dL 8.8(L)  Total Protein 6.5 - 8.1 g/dL 7.0  Total Bilirubin 0.3 - 1.2 mg/dL 0.5  Alkaline Phos 38 - 126 U/L 82  AST 15 - 41 U/L 23  ALT 14 - 54 U/L 21   CBC Latest Ref Rng  & Units 01/14/2017  WBC 3.6 - 11.0 K/uL 18.6(H)  Hemoglobin 12.0 - 16.0 g/dL 14.8  Hematocrit 35.0 - 47.0 % 43.7  Platelets 150 - 440 K/uL 318    No images are attached to the encounter.  No results found.   Assessment and plan- Patient is a 36 y.o. female referred to [place] for evaluation of leucocytosis mainly leucocytosis and lymphocytosis  Leucocytosis has been a chronic issue and patient states she has had this for several years. I discussed the results oftesting as mentioned above including peripheral flow cytometry and bcr abl testign which was negtaive for primary hematological disorder. I suspect this is reactuve from underlying smoking or some other cause given elevated ESR, CRP. No further testing at this time. Repeat cbc in 6 months time  B12 deficiency- s/p 4 does of B12 shots. Patient would  Like to try oral therapy. Will prescribe 1000 mcg daily and recheck b12  in 6 months time. She also needs to take OTC oral folate   Visit Diagnosis 1. B12 deficiency   2. Bandemia      Dr. Randa Evens, MD, MPH [place] [facility] Pager- [phone] 02/18/2017 11:37 AM

## 2017-02-20 ENCOUNTER — Ambulatory Visit: Payer: Self-pay

## 2017-02-22 IMAGING — MR MR FOOT*L* WO/W CM
6 of 7 series · 32 of 40 positions shown · IV contrast (20mL MULTIHANCE)
Comparison: None.

CLINICAL DATA: Left foot pain at the ball of the foot and into the
toes. Question lesion of the plantar nerve.

EXAM:
MRI OF THE LEFT FOREFOOT WITHOUT AND WITH CONTRAST
TECHNIQUE: Multiplanar, multisequence MR imaging of the left forefoot was
performed both before and after administration of intravenous
contrast.
CONTRAST:  20 ml MULTIHANCE GADOBENATE DIMEGLUMINE 529 MG/ML IV SOLN

[Series 4: T1 · coronal · 3.0mm · 0.75mm/px · 8 of 38 slices shown (1 of 2)]
[im 1/38]
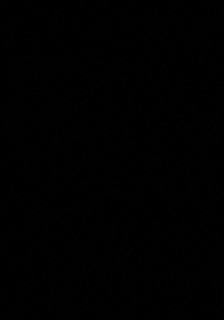
[im 6/38]
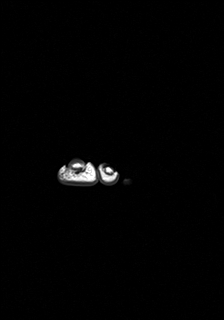
[im 11/38]
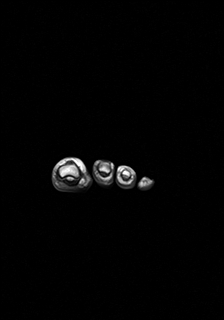
[im 16/38]
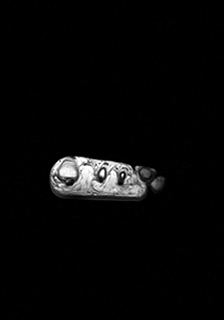
[im 22/38]
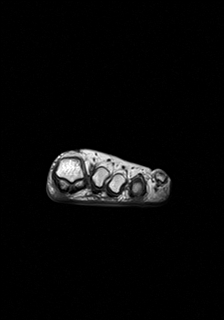
[im 27/38]
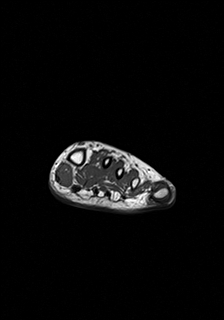
[im 32/38]
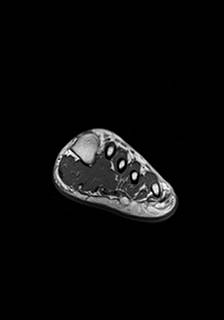
[im 38/38]
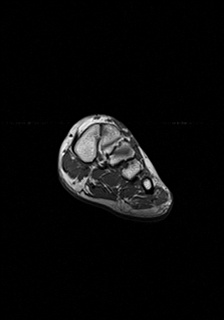

[Series 5: T2 fat-sat · coronal · 3.0mm · 0.43mm/px · 8 of 38 slices shown (1 of 2)]
[im 1/38]
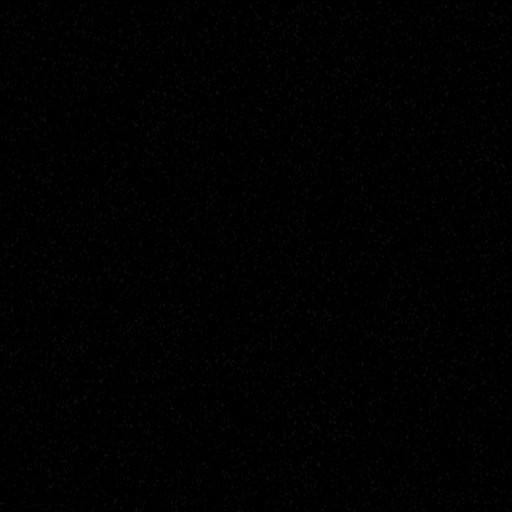
[im 6/38]
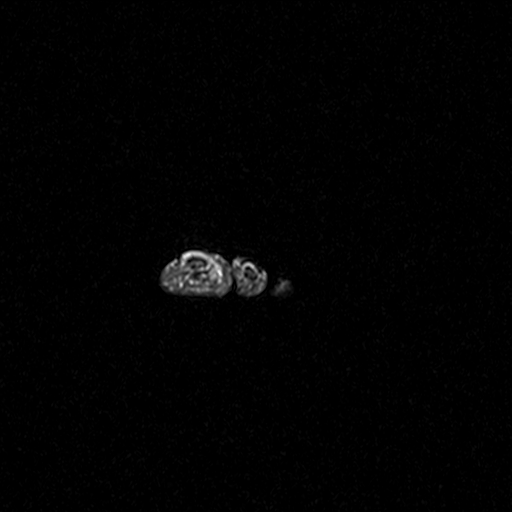
[im 11/38]
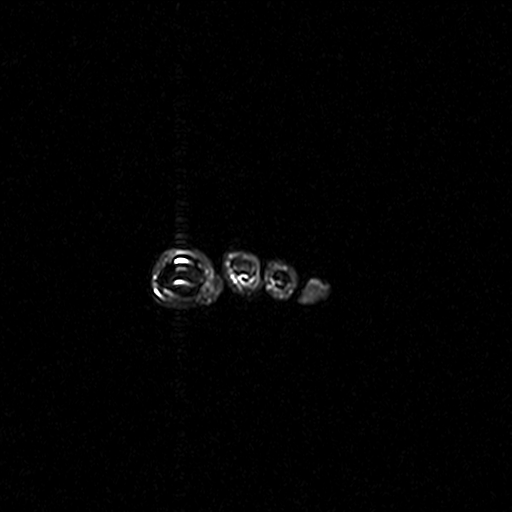
[im 16/38]
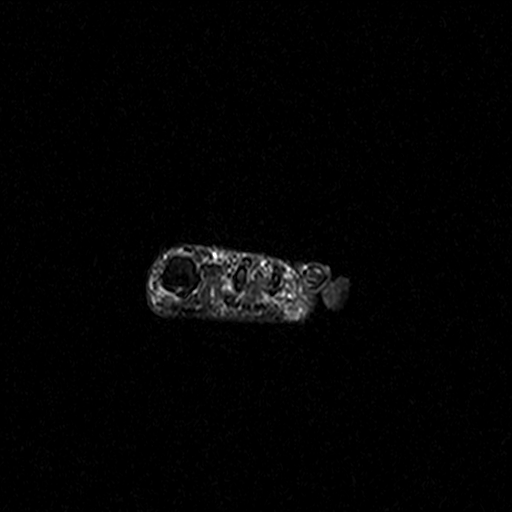
[im 22/38]
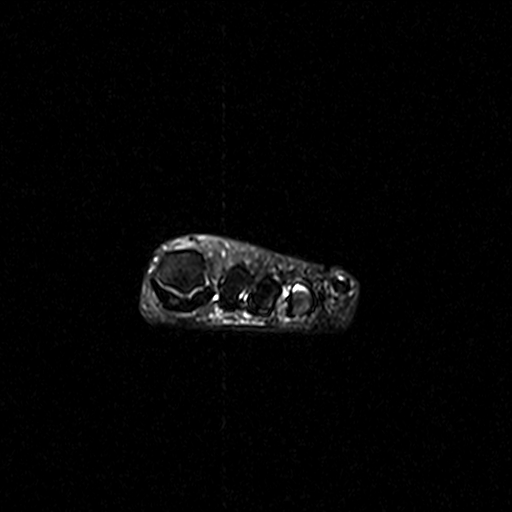
[im 27/38]
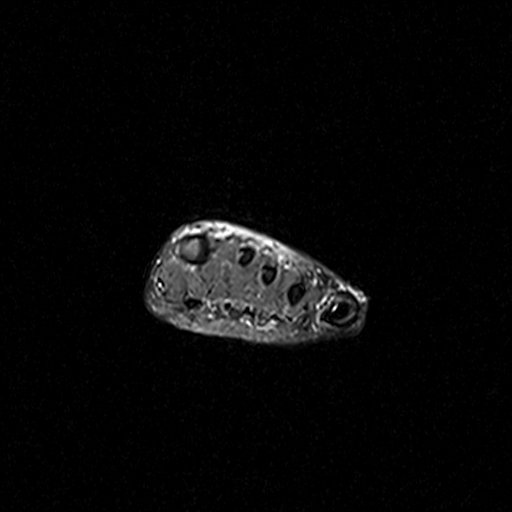
[im 32/38]
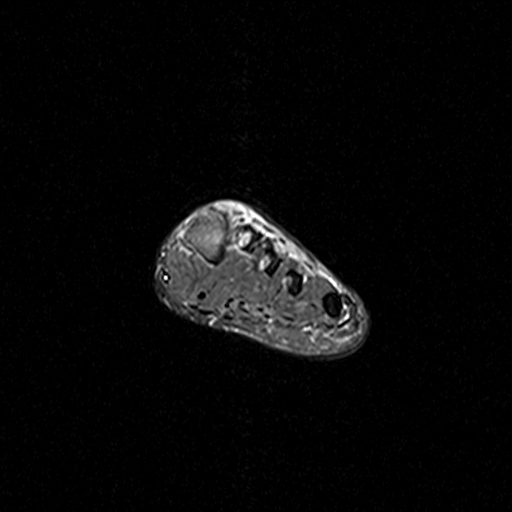
[im 38/38]
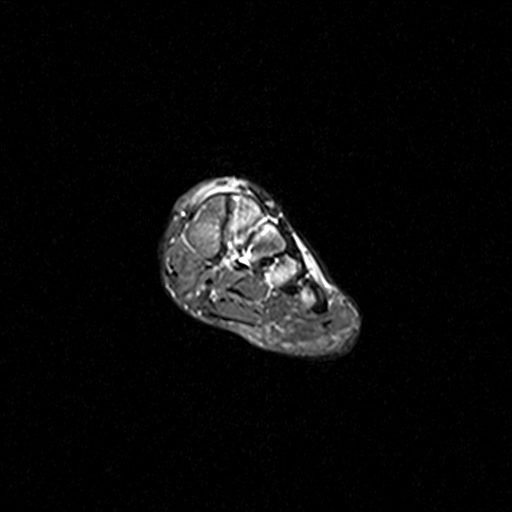

[Series 6: T1 · axial · 3.0mm · 0.69mm/px · z∈[-69,+1]mm · 5 of 23 slices shown (2 of 2)]
[im 1/23]
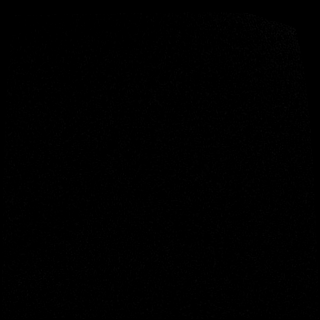
[im 6/23]
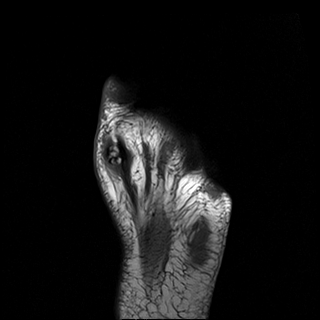
[im 12/23]
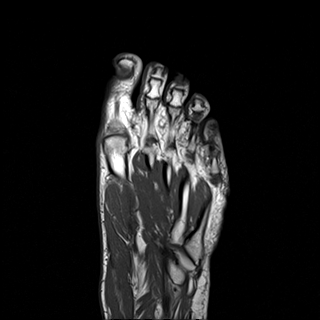
[im 17/23]
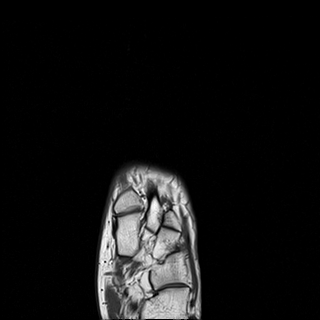
[im 23/23]
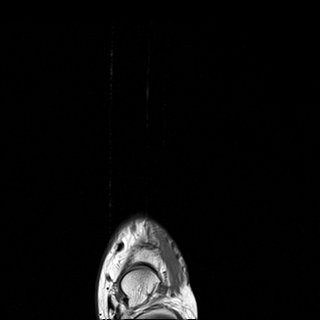

[Series 7: STIR · axial · 3.0mm · 0.43mm/px · z∈[-69,-24]mm · 3 of 23 slices shown]
[im 1/23]
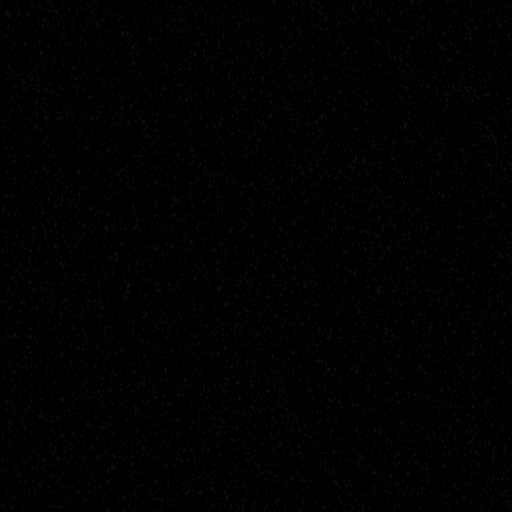
[im 8/23]
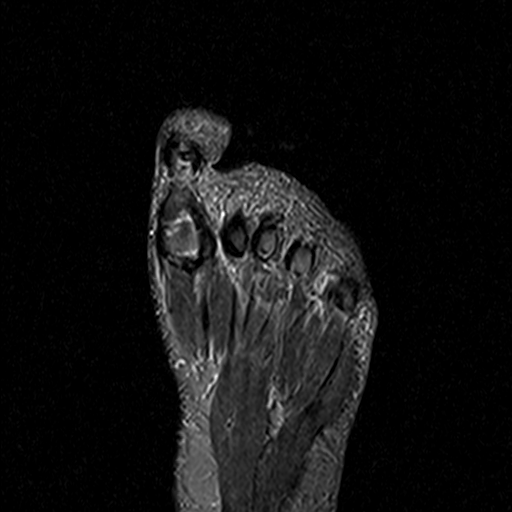
[im 15/23]
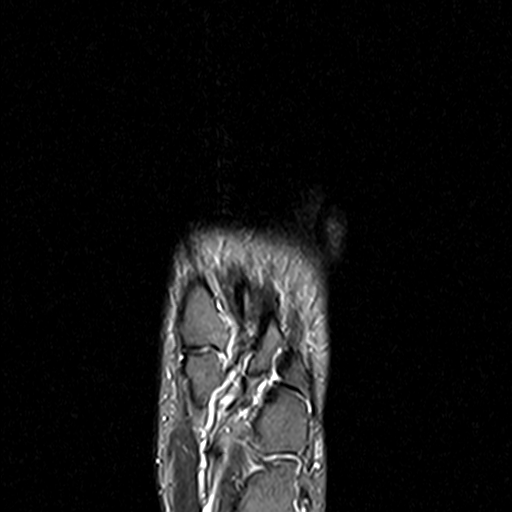

[Series 8: T2 fat-sat · axial · 3.0mm · 0.57mm/px · z∈[-69,+1]mm · 4 of 23 slices shown (2 of 2)]
[im 1/23]
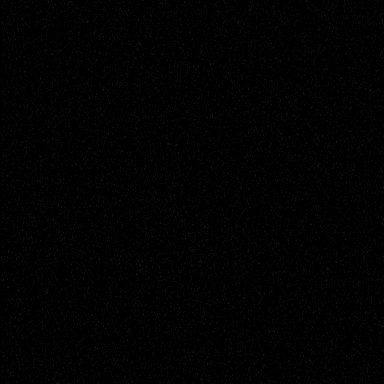
[im 8/23]
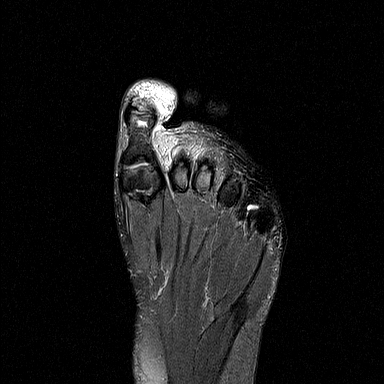
[im 15/23]
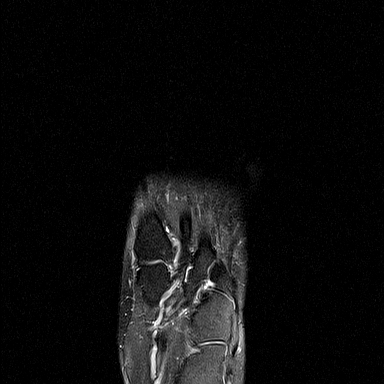
[im 23/23]
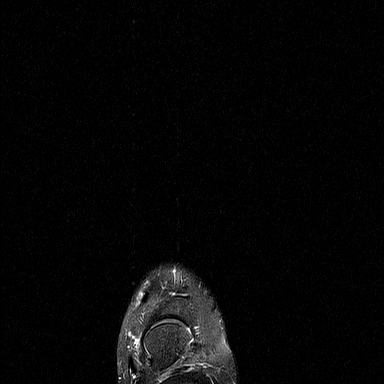

[Series 10: T1 fat-sat · axial · 3.0mm · 0.69mm/px · z∈[-69,+1]mm · 4 of 23 slices shown]
[im 1/23]
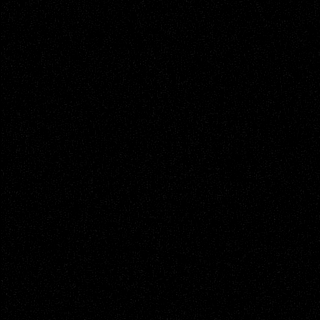
[im 8/23]
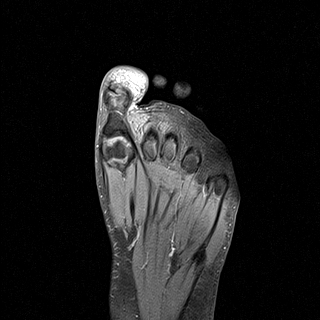
[im 15/23]
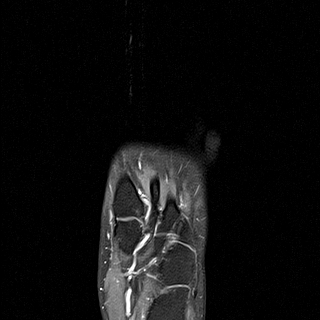
[im 23/23]
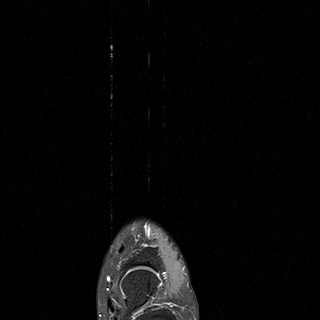

[32 of 40 positions shown; findings below may reference images not displayed]

FINDINGS: Bones/Joint/Cartilage

Tiny benign cyst in the head of the proximal phalanx of the great
toe is noted. Bone marrow signal is otherwise normal.

Ligaments

Negative.

Muscles and Tendons

Intact.

Soft tissues

No mass lesion is identified. A tiny fluid collection subjacent to
the fifth MTP joint measures 0.7 cm transverse by 0.2 cm
craniocaudal most consistent with a tiny adventitial bursa. No
neuroma is identified.
IMPRESSION: Negative for neuroma or other solid lesion.

Tiny adventitial cyst subjacent to the fifth MTP joint.

## 2017-03-13 ENCOUNTER — Ambulatory Visit: Payer: Self-pay

## 2017-04-10 ENCOUNTER — Ambulatory Visit: Payer: Self-pay

## 2017-05-08 ENCOUNTER — Ambulatory Visit: Payer: Self-pay

## 2017-05-21 ENCOUNTER — Other Ambulatory Visit: Payer: Self-pay | Admitting: Oncology

## 2017-05-21 MED ORDER — CYANOCOBALAMIN 1000 MCG/ML IJ SOLN: 1000.0000 ug | INTRAMUSCULAR | 5 refills | Status: AC

## 2017-05-21 MED ORDER — B-12 1000 MCG PO CAPS: 1.0000 | ORAL_CAPSULE | Freq: Every day | ORAL | 2 refills | Status: AC

## 2017-05-21 NOTE — Telephone Encounter (Signed)
Pt called and asked for refill on her B12. Thank you

## 2017-05-21 NOTE — Addendum Note (Signed)
Addended by: Ivar DrapeELLINGTON, Judi Jaffe C on: 05/21/2017 01:26 PM   Modules accepted: Orders

## 2017-08-16 ENCOUNTER — Other Ambulatory Visit: Payer: Self-pay | Admitting: *Deleted

## 2017-08-16 DIAGNOSIS — E538 Deficiency of other specified B group vitamins: Secondary | ICD-10-CM

## 2017-08-19 ENCOUNTER — Inpatient Hospital Stay: Payer: Medicare Other | Attending: Oncology | Admitting: Oncology

## 2017-08-19 ENCOUNTER — Other Ambulatory Visit: Payer: Self-pay | Admitting: *Deleted

## 2017-08-19 ENCOUNTER — Inpatient Hospital Stay: Payer: Medicare Other

## 2017-08-19 DIAGNOSIS — E538 Deficiency of other specified B group vitamins: Secondary | ICD-10-CM

## 2017-08-19 DIAGNOSIS — D72829 Elevated white blood cell count, unspecified: Secondary | ICD-10-CM

## 2017-08-19 NOTE — Progress Notes (Deleted)
Hematology/Oncology Consult note [facility]  [phone] [phone]  Patient Care Team: Sharyne Peach, MD as PCP - General (Family Medicine)   Name of the patient: Kimberly Lambert  [id number]  [birth year]   Date of visit: 08/19/17  Diagnosis- 1.  leucocytosis likely reactive 2. B12 deficiency  Chief complaint/ Reason for visit- f/u of B12 deficiency and leucocytosis  Heme/Onc history: patient is a 36 year old female who was been referred to [place] for evaluation and management of leukocytosis. CBC from 01/09/2017 revealed white count of 18.8, H&H of 14.6/43.4 and a platelet count of 354. Differential on the CBC CEA showed neutrophilia and lymphocytosis and mild monocytosis. Prior CBC from 01/03/2017 showed a white count of 20.1. CMP was within normal limits. Prior CBC from August 2016 showed a white count of 18.6. Patient reports that she is always had a white count that was high even when she was in [place] which was more than 3 years ago. She has been smoking daily about 2-3 cigarettes per day over the last 20 years. Denies any appetite loss or unintentional weight loss or recurrent infections. She does report burning sensation in her feet  Results of blood work from 01/14/2017 were as follows: CBC showed white count of 18.6, H&H of 14.8/43.7 and a platelet count of 318. Differential showing absolute neutrophilia along with lymphocytosis and monocytosis as well as mild basophilia. Review of peripheral blood smear showed absolute neutrophilia with lymphocytosis and monocytosis. RBC WBC and platelet morphology is unremarkable. ESR was elevated at 23and CRP was elevated at 3.5. Peripheral flow cytometry showed no significant diagnostic immunophenotyping abnormality. Lymphocytosis is due to increase in Total T cells-T helper cells Tsuppressor cells and Bcells. Increase in multiple lymphocyte subsets may suggest a reactive process. BCR abl testing  was negative for CML. Folate level was low at 4.6. B12 was low at 133.   Patient received 4 monthly B12 injections and then switched to oral B12. She also takes PO folate  Interval history- ***  ECOG PS- *** Pain scale- *** Opioid associated constipation- ***  Review of systems- Review of Systems  Constitutional: Negative for chills, fever, malaise/fatigue and weight loss.  HENT: Negative for congestion, ear discharge and nosebleeds.   Eyes: Negative for blurred vision.  Respiratory: Negative for cough, hemoptysis, sputum production, shortness of breath and wheezing.   Cardiovascular: Negative for chest pain, palpitations, orthopnea and claudication.  Gastrointestinal: Negative for abdominal pain, blood in stool, constipation, diarrhea, heartburn, melena, nausea and vomiting.  Genitourinary: Negative for dysuria, flank pain, frequency, hematuria and urgency.  Musculoskeletal: Negative for back pain, joint pain and myalgias.  Skin: Negative for rash.  Neurological: Negative for dizziness, tingling, focal weakness, seizures, weakness and headaches.  Endo/Heme/Allergies: Does not bruise/bleed easily.  Psychiatric/Behavioral: Negative for depression and suicidal ideas. The patient does not have insomnia.      Current treatment- ***  Allergies  Allergen Reactions  . Claritin-D 12 Hour [Loratadine-Pseudoephedrine Er] Rash  . Naproxen Rash  . Penicillins Rash     Past Medical History:  Diagnosis Date  . High cholesterol      Past Surgical History:  Procedure Laterality Date  . CHOLECYSTECTOMY    . CHOLECYSTECTOMY     17 years ago  . KNEE ARTHROSCOPY    . TUBAL LIGATION      Social History   Social History  . Marital status: Single    Spouse name: N/A  . Number of children: N/A  .  Years of education: N/A   Occupational History  . Not on file.   Social History Main Topics  . Smoking status: Current Every Day Smoker    Packs/day: 1.50    Years: 18.00    Types:  Cigarettes  . Smokeless tobacco: Never Used  . Alcohol use No  . Drug use: No  . Sexual activity: Yes   Other Topics Concern  . Not on file   Social History Narrative  . No narrative on file    Family History  Problem Relation Age of Onset  . Cancer Paternal Aunt      Current Outpatient Prescriptions:  .  aspirin EC 81 MG tablet, Take 81 mg by mouth daily., Disp: , Rfl:  .  cyanocobalamin (,VITAMIN B-12,) 1000 MCG/ML injection, Inject 1 mL (1,000 mcg total) into the muscle every 30 (thirty) days., Disp: 1 mL, Rfl: 5 .  Cyanocobalamin (B-12) 1000 MCG CAPS, Take 1 tablet by mouth daily., Disp: 30 capsule, Rfl: 2 .  pravastatin (PRAVACHOL) 20 MG tablet, Take 20 mg by mouth daily., Disp: , Rfl:  .  propranolol (INDERAL) 40 MG tablet, Take 40 mg by mouth 2 (two) times daily., Disp: , Rfl:   Physical exam: There were no vitals filed for this visit. Physical Exam  Constitutional: She is oriented to person, place, and time and well-developed, well-nourished, and in no distress.  HENT:  Head: Normocephalic and atraumatic.  Eyes: Pupils are equal, round, and reactive to light. EOM are normal.  Neck: Normal range of motion.  Cardiovascular: Normal rate, regular rhythm and normal heart sounds.   Pulmonary/Chest: Effort normal and breath sounds normal.  Abdominal: Soft. Bowel sounds are normal.  Neurological: She is alert and oriented to person, place, and time.  Skin: Skin is warm and dry.     CMP Latest Ref Rng & Units 06/23/2015  Glucose 65 - 99 mg/dL 96  BUN 6 - 20 mg/dL 7  Creatinine 0.44 - 1.00 mg/dL 0.58  Sodium 135 - 145 mmol/L 139  Potassium 3.5 - 5.1 mmol/L 3.7  Chloride 101 - 111 mmol/L 107  CO2 22 - 32 mmol/L 24  Calcium 8.9 - 10.3 mg/dL 8.8(L)  Total Protein 6.5 - 8.1 g/dL 7.0  Total Bilirubin 0.3 - 1.2 mg/dL 0.5  Alkaline Phos 38 - 126 U/L 82  AST 15 - 41 U/L 23  ALT 14 - 54 U/L 21   CBC Latest Ref Rng & Units 01/14/2017  WBC 3.6 - 11.0 K/uL 18.6(H)  Hemoglobin  12.0 - 16.0 g/dL 14.8  Hematocrit 35.0 - 47.0 % 43.7  Platelets 150 - 440 K/uL 318      Assessment and plan- Patient is a 36 y.o. female with leucocytosis and b12 and folate deficiency  1. Leucocytosis- wbc stable around 18. Differential mainly shows neutrophilia. Flow cytometry and bcr abl testing did not reveal any abnormality. Wbc today is . Continue to monitor, If there is a consistent rise in her wbc, I will consider bone marrow biopsy at that time Repeat cbc with diff in 6 months  2. b12 and folate deficiency- levels from today pending. Continue oral supplements  RTC in  6 months with cbc diff, b12  And folate   Visit Diagnosis 1. B12 deficiency   2. Neutrophilia   3. Folate deficiency      Dr. Randa Evens, MD, MPH [facility] Pager- [phone] 08/19/2017 8:12 AM

## 2020-02-16 ENCOUNTER — Emergency Department (HOSPITAL_COMMUNITY)
Admission: EM | Admit: 2020-02-16 | Discharge: 2020-02-16 | Disposition: A | Discharge status: Home / Self Care | Payer: Medicare Other | Attending: Emergency Medicine | Admitting: Emergency Medicine

## 2020-02-16 ENCOUNTER — Other Ambulatory Visit: Payer: Self-pay

## 2020-02-16 ENCOUNTER — Encounter (HOSPITAL_COMMUNITY): Payer: Self-pay | Admitting: Emergency Medicine

## 2020-02-16 DIAGNOSIS — F17210 Nicotine dependence, cigarettes, uncomplicated: Secondary | ICD-10-CM | POA: Insufficient documentation

## 2020-02-16 DIAGNOSIS — Z3202 Encounter for pregnancy test, result negative: Secondary | ICD-10-CM

## 2020-02-16 DIAGNOSIS — Z9851 Tubal ligation status: Secondary | ICD-10-CM | POA: Insufficient documentation

## 2020-02-16 LAB — POC URINE PREG, ED: Preg Test, Ur: NEGATIVE

## 2020-02-16 NOTE — ED Triage Notes (Signed)
Pt had home positive home pregnancy test with slight line.  Pt had tube tired 20 years ago.

## 2020-02-16 NOTE — Discharge Instructions (Addendum)
You were seen in the emergency department today for a pregnancy test, this was negative.  Please follow-up with your OB/GYN doctor or primary care provider should you continue to not have a period.  Return to the emergency department for new or worsening symptoms or any other concerns.

## 2020-02-16 NOTE — ED Provider Notes (Signed)
[facility] EMERGENCY DEPARTMENT Provider Note   CSN: [id number] Arrival date & time: 02/16/20  1545     History Chief Complaint  Patient presents with  . Possible Pregnancy    Kimberly Lambert is a 39 y.o. female with a history of tobacco abuse, hyperlipidemia, and prior tubal ligation who presents to the emergency department requesting a pregnancy test.  Patient states that her last menstrual period was 01/04/2020, she had not had a period yet this month became concerned therefore she took an at home pregnancy test which she thinks might of had a faint line indicating that it was positive.  She states she is concerned as she has had a tubal ligation.  She is currently sexually active without protection.  She has no nausea, vomiting, abdominal pain, vaginal bleeding, or vaginal discharge.  No concern for STI.  No alleviating or aggravating factors.  HPI     Past Medical History:  Diagnosis Date  . High cholesterol     Patient Active Problem List   Diagnosis Date Noted  . B12 deficiency 01/15/2017  . Dysrhythmia 01/14/2017  . Hyperlipidemia, unspecified 01/14/2017  . 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 CHOLECYSTECTOMY    . CHOLECYSTECTOMY     17 years ago  . KNEE ARTHROSCOPY    . TUBAL LIGATION       OB History    Gravida  1   Para      Term      Preterm      AB      Living        SAB      TAB      Ectopic      Multiple      Live Births              Family History  Problem Relation Age of Onset  . Cancer Paternal Aunt     Social History   Tobacco Use  . Smoking status: Current Every Day Smoker    Packs/day: 1.50    Years: 18.00    Pack years: 27.00    Types: Cigarettes  . Smokeless tobacco: Never Used  Substance Use Topics  . Alcohol use: No  . Drug use: No    Home Medications Prior to Admission medications   Medication Sig Start Date End Date Taking? Authorizing Provider    aspirin EC 81 MG tablet Take 81 mg by mouth daily.    [provider]  cyanocobalamin (,VITAMIN B-12,) 1000 MCG/ML injection Inject 1 mL (1,000 mcg total) into the muscle every 30 (thirty) days. 05/21/17   Sindy Guadeloupe, MD  Cyanocobalamin (B-12) 1000 MCG CAPS Take 1 tablet by mouth daily. 05/21/17   Sindy Guadeloupe, MD  pravastatin (PRAVACHOL) 20 MG tablet Take 20 mg by mouth daily.    [provider]  propranolol (INDERAL) 40 MG tablet Take 40 mg by mouth 2 (two) times daily.    [provider]    Allergies    Corticosteroids, Claritin-d 12 hour [loratadine-pseudoephedrine er], Naproxen, and Penicillins  Review of Systems   Review of Systems  Constitutional: Negative for chills and fever.  Respiratory: Negative for shortness of breath.   Cardiovascular: Negative for chest pain.  Gastrointestinal: Negative for abdominal pain, nausea and vomiting.  Genitourinary: Negative for dysuria, pelvic pain, vaginal bleeding and vaginal discharge.  Neurological: Negative for syncope.    Physical Exam Updated Vital Signs BP Marland Kitchen)  134/93 (BP Location: Right Arm)   Pulse 84   Temp 98.3 F (36.8 C) (Oral)   Resp 16   Ht 5\' 4"  (1.626 m)   Wt 84.4 kg   LMP 01/04/2020 (Exact Date)   SpO2 99%   BMI 31.93 kg/m   Physical Exam Vitals and nursing note reviewed.  Constitutional:      General: She is not in acute distress.    Appearance: She is well-developed. She is not toxic-appearing.  HENT:     Head: Normocephalic and atraumatic.  Eyes:     General:        Right eye: No discharge.        Left eye: No discharge.     Conjunctiva/sclera: Conjunctivae normal.  Cardiovascular:     Rate and Rhythm: Normal rate and regular rhythm.  Pulmonary:     Effort: Pulmonary effort is normal. No respiratory distress.     Breath sounds: Normal breath sounds. No wheezing, rhonchi or rales.  Abdominal:     General: There is no distension.     Palpations: Abdomen is soft.      Tenderness: There is no abdominal tenderness. There is no guarding or rebound.  Musculoskeletal:     Cervical back: Neck supple.  Skin:    General: Skin is warm and dry.     Findings: No rash.  Neurological:     Mental Status: She is alert.     Comments: Clear speech.   Psychiatric:        Behavior: Behavior normal.     ED Results / Procedures / Treatments   Labs (all labs ordered are listed, but only abnormal results are displayed) Labs Reviewed  POC URINE PREG, ED    EKG None  Radiology No results found.  Procedures Procedures (including critical care time)  Medications Ordered in ED Medications - No data to display  ED Course  I have reviewed the triage vital signs and the nursing notes.  Pertinent labs & imaging results that were available during my care of the patient were reviewed by me and considered in my medical decision making (see chart for details).    MDM Rules/Calculators/A&P                      Patient with prior tubal ligation presents to the emergency department with concern for possible pregnancy.  LMP 01/04/20.  Asymptomatic.  Pregnancy test in the emergency department is negative.  Patient appears appropriate for discharge home. I discussed results, need for follow-up, and return precautions with the patient. Provided opportunity for questions, patient confirmed understanding and is in agreement with plan.   Final Clinical Impression(s) / ED Diagnoses Final diagnoses:  Negative pregnancy test    Rx / DC Orders ED Discharge Orders    None       01/06/20, PA-C 02/16/20 1700    02/18/20, MD 02/17/20 1825

## 2020-02-16 NOTE — ED Triage Notes (Signed)
Denies pain,  Last period Feb. 15th.

## 2020-07-04 ENCOUNTER — Emergency Department (HOSPITAL_COMMUNITY)
Admission: EM | Admit: 2020-07-04 | Discharge: 2020-07-04 | Disposition: A | Discharge status: Home / Self Care | Payer: Self-pay | Attending: Emergency Medicine | Admitting: Emergency Medicine

## 2020-07-04 ENCOUNTER — Encounter (HOSPITAL_COMMUNITY): Payer: Self-pay | Admitting: *Deleted

## 2020-07-04 DIAGNOSIS — Z5321 Procedure and treatment not carried out due to patient leaving prior to being seen by health care provider: Secondary | ICD-10-CM | POA: Insufficient documentation

## 2020-07-04 DIAGNOSIS — W108XXA Fall (on) (from) other stairs and steps, initial encounter: Secondary | ICD-10-CM | POA: Insufficient documentation

## 2020-07-04 DIAGNOSIS — M549 Dorsalgia, unspecified: Secondary | ICD-10-CM | POA: Insufficient documentation

## 2020-07-04 DIAGNOSIS — Y939 Activity, unspecified: Secondary | ICD-10-CM | POA: Insufficient documentation

## 2020-07-04 DIAGNOSIS — M25559 Pain in unspecified hip: Secondary | ICD-10-CM | POA: Insufficient documentation

## 2020-07-04 DIAGNOSIS — Y92009 Unspecified place in unspecified non-institutional (private) residence as the place of occurrence of the external cause: Secondary | ICD-10-CM | POA: Insufficient documentation

## 2020-07-04 DIAGNOSIS — Y999 Unspecified external cause status: Secondary | ICD-10-CM | POA: Insufficient documentation

## 2020-07-04 NOTE — ED Triage Notes (Signed)
Fell down some steps at home, c/o pain in back and hip

## 2020-07-04 NOTE — ED Notes (Signed)
Left prior to room assignmnet
